# Patient Record
Sex: Female | Born: 2000 | Race: White | Hispanic: No | Marital: Single | Smoking: Never smoker
Health system: Southern US, Community
[De-identification: ages and names within clinical notes are randomized; demographics above are authoritative.]

## PROBLEM LIST (undated history)

## (undated) DIAGNOSIS — R45851 Suicidal ideations: Secondary | ICD-10-CM

## (undated) DIAGNOSIS — F329 Major depressive disorder, single episode, unspecified: Secondary | ICD-10-CM

## (undated) DIAGNOSIS — F419 Anxiety disorder, unspecified: Secondary | ICD-10-CM

## (undated) DIAGNOSIS — E7800 Pure hypercholesterolemia, unspecified: Secondary | ICD-10-CM

## (undated) DIAGNOSIS — F32A Depression, unspecified: Secondary | ICD-10-CM

## (undated) DIAGNOSIS — G43909 Migraine, unspecified, not intractable, without status migrainosus: Secondary | ICD-10-CM

## (undated) DIAGNOSIS — N3281 Overactive bladder: Secondary | ICD-10-CM

## (undated) HISTORY — DX: Suicidal ideations: R45.851

## (undated) HISTORY — DX: Anxiety disorder, unspecified: F41.9

## (undated) HISTORY — PX: TONSILLECTOMY: SUR1361

---
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
## 2019-11-28 ENCOUNTER — Emergency Department (HOSPITAL_COMMUNITY): Payer: Medicaid Other

## 2019-11-28 ENCOUNTER — Encounter (HOSPITAL_COMMUNITY): Payer: Self-pay | Admitting: Emergency Medicine

## 2019-11-28 ENCOUNTER — Emergency Department (HOSPITAL_COMMUNITY)
Admission: EM | Admit: 2019-11-28 | Discharge: 2019-11-28 | Disposition: A | Discharge status: Home / Self Care | Payer: Medicaid Other | Attending: Emergency Medicine | Admitting: Emergency Medicine

## 2019-11-28 ENCOUNTER — Other Ambulatory Visit: Payer: Self-pay

## 2019-11-28 DIAGNOSIS — Y93I9 Activity, other involving external motion: Secondary | ICD-10-CM | POA: Insufficient documentation

## 2019-11-28 DIAGNOSIS — Z79899 Other long term (current) drug therapy: Secondary | ICD-10-CM | POA: Insufficient documentation

## 2019-11-28 DIAGNOSIS — M791 Myalgia, unspecified site: Secondary | ICD-10-CM | POA: Insufficient documentation

## 2019-11-28 DIAGNOSIS — Y92410 Unspecified street and highway as the place of occurrence of the external cause: Secondary | ICD-10-CM | POA: Insufficient documentation

## 2019-11-28 DIAGNOSIS — M25511 Pain in right shoulder: Secondary | ICD-10-CM | POA: Insufficient documentation

## 2019-11-28 DIAGNOSIS — R42 Dizziness and giddiness: Secondary | ICD-10-CM | POA: Insufficient documentation

## 2019-11-28 DIAGNOSIS — Y999 Unspecified external cause status: Secondary | ICD-10-CM | POA: Diagnosis not present

## 2019-11-28 DIAGNOSIS — M542 Cervicalgia: Secondary | ICD-10-CM | POA: Insufficient documentation

## 2019-11-28 DIAGNOSIS — R519 Headache, unspecified: Secondary | ICD-10-CM | POA: Diagnosis present

## 2019-11-28 DIAGNOSIS — M7918 Myalgia, other site: Secondary | ICD-10-CM

## 2019-11-28 LAB — POC URINE PREG, ED: Preg Test, Ur: NEGATIVE
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
## 2019-11-28 NOTE — Discharge Instructions (Signed)
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
## 2020-01-23 DIAGNOSIS — F909 Attention-deficit hyperactivity disorder, unspecified type: Secondary | ICD-10-CM | POA: Insufficient documentation

## 2020-01-23 DIAGNOSIS — F419 Anxiety disorder, unspecified: Secondary | ICD-10-CM | POA: Insufficient documentation

## 2021-01-28 ENCOUNTER — Encounter (HOSPITAL_COMMUNITY): Payer: Self-pay | Admitting: *Deleted

## 2021-01-28 ENCOUNTER — Emergency Department (HOSPITAL_COMMUNITY)
Admission: EM | Admit: 2021-01-28 | Discharge: 2021-01-28 | Disposition: A | Discharge status: Home / Self Care | Payer: Medicaid Other | Attending: Emergency Medicine | Admitting: Emergency Medicine

## 2021-01-28 ENCOUNTER — Emergency Department (HOSPITAL_COMMUNITY): Payer: Medicaid Other

## 2021-01-28 ENCOUNTER — Other Ambulatory Visit: Payer: Self-pay

## 2021-01-28 DIAGNOSIS — R300 Dysuria: Secondary | ICD-10-CM | POA: Diagnosis not present

## 2021-01-28 DIAGNOSIS — R109 Unspecified abdominal pain: Secondary | ICD-10-CM | POA: Diagnosis not present

## 2021-01-28 DIAGNOSIS — R3915 Urgency of urination: Secondary | ICD-10-CM | POA: Diagnosis not present

## 2021-01-28 DIAGNOSIS — R10A0 Flank pain, unspecified side: Secondary | ICD-10-CM

## 2021-01-28 HISTORY — DX: Pure hypercholesterolemia, unspecified: E78.00

## 2021-01-28 HISTORY — DX: Overactive bladder: N32.81

## 2021-01-28 LAB — URINALYSIS, ROUTINE W REFLEX MICROSCOPIC
Bacteria, UA: NONE SEEN
Bilirubin Urine: NEGATIVE
Glucose, UA: NEGATIVE mg/dL
Hgb urine dipstick: NEGATIVE
Ketones, ur: NEGATIVE mg/dL
Nitrite: NEGATIVE
Protein, ur: NEGATIVE mg/dL
Specific Gravity, Urine: 1.021 (ref 1.005–1.030)
pH: 5 (ref 5.0–8.0)

## 2021-01-28 LAB — POC URINE PREG, ED: Preg Test, Ur: NEGATIVE

## 2021-01-28 MED ORDER — CEPHALEXIN 500 MG PO CAPS: 500.0000 mg | ORAL_CAPSULE | Freq: Two times a day (BID) | ORAL | 0 refills | Status: DC

## 2021-01-28 MED ORDER — PHENAZOPYRIDINE HCL 200 MG PO TABS: 200.0000 mg | ORAL_TABLET | Freq: Three times a day (TID) | ORAL | 0 refills | Status: DC

## 2021-01-28 NOTE — Discharge Instructions (Signed)
You are being treated for urinary tract infection given your symptoms.  Your urine is negative except for some white blood cells, a culture is currently pending.  Take the entire course of the antibiotics prescribed in addition you may use the Pyridium which can help you with the urgency and pain with urination.  This will turn your urine bright orange and is normal.  Your CT scan is negative for evidence of a kidney stone or other source of your symptoms.  Plan to see your doctor for recheck within 1 week if your symptoms are not completely resolved with this treatment plan.

## 2021-01-28 NOTE — ED Provider Notes (Signed)
[facility] EMERGENCY DEPARTMENT Provider Note   CSN: [id number] Arrival date & time: 01/28/21  1255     History Chief Complaint  Patient presents with  . Flank Pain    Cheryl Mcgrath is a 20 y.o. female with a history as outlined below, most significant for a distant history of overactive bladder in childhood which she outgrew, but does have occasional UTIs, most recently July 2021 presenting with a 2-week history of intermittent right flank pain with cloudy sometimes blood-tinged urine and urgency with increased frequency of urination.  She describes burning pain at the end of her urine stream and a pressure sensation at her urethra.  She denies fevers or chills, nausea or vomiting, denies personal or family history of kidney stones.  She also denies vaginal discharge, also no risk factors for STDs.  She has taken Tylenol for her symptoms with mild improvement in pain.  HPI     Past Medical History:  Diagnosis Date  . Depression   . High cholesterol   . Migraine   . Overactive bladder     Patient Active Problem List   Diagnosis Date Noted  . MDD (major depressive disorder), single episode, severe , no psychosis (HCC) 09/29/2014  . ODD (oppositional defiant disorder) 09/29/2014    Past Surgical History:  Procedure Laterality Date  . TONSILLECTOMY       OB History    Gravida  0   Para  0   Term  0   Preterm  0   AB  0   Living  0     SAB  0   IAB  0   Ectopic  0   Multiple  0   Live Births  0           History reviewed. No pertinent family history.  Social History   Tobacco Use  . Smoking status: Never Smoker  . Smokeless tobacco: Never Used  Vaping Use  . Vaping Use: Never used  Substance Use Topics  . Alcohol use: No  . Drug use: No    Home Medications Prior to Admission medications   Medication Sig Start Date End Date Taking? Authorizing Provider  cephALEXin (KEFLEX) 500 MG capsule Take 1 capsule (500 mg total) by mouth 2 (two)  times daily. 01/28/21  Yes Hughes Wyndham, Raynelle Fanning, PA-C  phenazopyridine (PYRIDIUM) 200 MG tablet Take 1 tablet (200 mg total) by mouth 3 (three) times daily. 01/28/21  Yes Ester Mabe, Raynelle Fanning, PA-C  hydrocortisone (ANUSOL-HC) 25 MG suppository Place 1 suppository (25 mg total) rectally 2 (two) times daily. 08/24/17   Burgess Amor, PA-C  ibuprofen (ADVIL,MOTRIN) 600 MG tablet Take 1 tablet (600 mg total) by mouth every 6 (six) hours as needed. 08/24/17   Burgess Amor, PA-C    Allergies    Patient has no known allergies.  Review of Systems   Review of Systems  Constitutional: Negative for chills and fever.  HENT: Negative for congestion and sore throat.   Eyes: Negative.   Respiratory: Negative for chest tightness and shortness of breath.   Cardiovascular: Negative for chest pain.  Gastrointestinal: Negative for abdominal pain, nausea and vomiting.  Genitourinary: Positive for dysuria, flank pain and urgency. Negative for pelvic pain and vaginal discharge.  Musculoskeletal: Negative for arthralgias, joint swelling and neck pain.  Skin: Negative.  Negative for rash and wound.  Neurological: Negative for dizziness, weakness, light-headedness, numbness and headaches.  Psychiatric/Behavioral: Negative.   All other systems reviewed and are negative.  Physical Exam Updated Vital Signs BP (!) 144/88 (BP Location: Right Arm)   Pulse (!) 114   Temp 98.2 F (36.8 C) (Oral)   Resp 16   Ht 4\' 11"  (1.499 m)   Wt 122.7 kg   LMP 12/18/2020 Comment: home test - x 2  SpO2 100%   BMI 54.61 kg/m   Physical Exam Vitals and nursing note reviewed.  Constitutional:      Appearance: She is well-developed and well-nourished.  HENT:     Head: Normocephalic and atraumatic.  Eyes:     Conjunctiva/sclera: Conjunctivae normal.  Cardiovascular:     Rate and Rhythm: Normal rate and regular rhythm.     Pulses: Intact distal pulses.     Heart sounds: Normal heart sounds.  Pulmonary:     Effort: Pulmonary effort is normal.      Breath sounds: Normal breath sounds. No wheezing.  Abdominal:     General: Bowel sounds are normal.     Palpations: Abdomen is soft.     Tenderness: There is no abdominal tenderness. There is no right CVA tenderness, left CVA tenderness, guarding or rebound.  Musculoskeletal:        General: Normal range of motion.     Cervical back: Normal range of motion.  Skin:    General: Skin is warm and dry.  Neurological:     Mental Status: She is alert.  Psychiatric:        Mood and Affect: Mood and affect normal.     ED Results / Procedures / Treatments   Labs (all labs ordered are listed, but only abnormal results are displayed) Labs Reviewed  URINALYSIS, ROUTINE W REFLEX MICROSCOPIC - Abnormal; Notable for the following components:      Result Value   APPearance HAZY (*)    Leukocytes,Ua SMALL (*)    All other components within normal limits  URINE CULTURE  POC URINE PREG, ED    EKG None  Radiology CT Renal Stone Study  Result Date: 01/28/2021 CLINICAL DATA:  Intermittent right flank pain for 2 weeks. Hematuria. EXAM: CT ABDOMEN AND PELVIS WITHOUT CONTRAST TECHNIQUE: Multidetector CT imaging of the abdomen and pelvis was performed following the standard protocol without IV contrast. COMPARISON:  None. FINDINGS: Lower chest: Clear lung bases. Hepatobiliary: No focal liver abnormality is seen. No gallstones, gallbladder wall thickening, or biliary dilatation. Pancreas: Unremarkable. Spleen: Unremarkable. Adrenals/Urinary Tract: Unremarkable adrenal glands. No evidence of renal calculi, mass, or hydronephrosis. Unremarkable bladder. Stomach/Bowel: The stomach is unremarkable. There is no evidence of bowel obstruction or inflammation. The appendix is unremarkable. Vascular/Lymphatic: Normal caliber of the abdominal aorta. No enlarged lymph nodes. Reproductive: Unremarkable appearance of the uterus and ovaries. Other: No intraperitoneal free fluid. Musculoskeletal: No acute osseous  abnormality or suspicious osseous lesion. IMPRESSION: Unremarkable CT of the abdomen and pelvis. Electronically Signed   By: 03/28/2021 M.D.   On: 01/28/2021 15:56    Procedures Procedures   Medications Ordered in ED Medications - No data to display  ED Course  I have reviewed the triage vital signs and the nursing notes.  Pertinent labs & imaging results that were available during my care of the patient were reviewed by me and considered in my medical decision making (see chart for details).    MDM Rules/Calculators/A&P                          Labs and imaging reviewed, urine culture has been ordered.  Patient with dysuria including urgency and painful urination, positive for leukocytes in her urine, no bacteria or nitrites.  Given her symptoms, will place on short course of Keflex, Pyridium also given for symptom relief.  CT is negative for urinary tract stone and pyelonephritis.  She was advised to follow-up with her PCP if her symptoms do not improve with today's treatment plan.  She has no symptoms or history to suggest STD.   Final Clinical Impression(s) / ED Diagnoses Final diagnoses:  Flank pain  Dysuria    Rx / DC Orders ED Discharge Orders         Ordered    cephALEXin (KEFLEX) 500 MG capsule  2 times daily        01/28/21 1608    phenazopyridine (PYRIDIUM) 200 MG tablet  3 times daily        01/28/21 1608           Burgess Amor, Cordelia Poche 01/28/21 1631    Pollyann Savoy, MD 01/28/21 [phone]

## 2021-01-28 NOTE — ED Triage Notes (Signed)
Pt with right flank pain off and on x 2 weeks with blood in urine.  Pain at end of voiding from umbilical area that radiates down. Denies any fever or N/V.  Denies hx of kidney stones.  + hx of overactive bladder.

## 2021-01-30 LAB — URINE CULTURE: Culture: >=100000 — AB

## 2021-11-10 IMAGING — CT CT RENAL STONE PROTOCOL
2 of 4 series · 17 of 46 positions shown, 19 images · non-contrast
Comparison: None.

CLINICAL DATA: Intermittent right flank pain for 2 weeks.
Hematuria.

EXAM:
CT ABDOMEN AND PELVIS WITHOUT CONTRAST
TECHNIQUE: Multidetector CT imaging of the abdomen and pelvis was performed
following the standard protocol without IV contrast.

[Series 2: axial st · axial · 0.83mm/px · z∈[+792,+1197]mm · 14 of 95 slices shown, 16 images]
[im 7/95  soft-tissue]
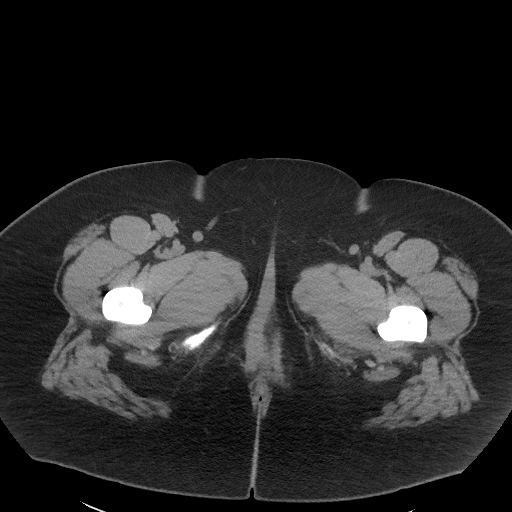
[im 7/95  bone]
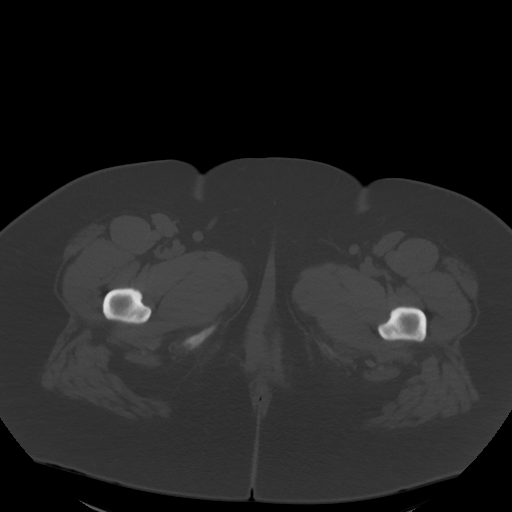
[im 13/95  soft-tissue]
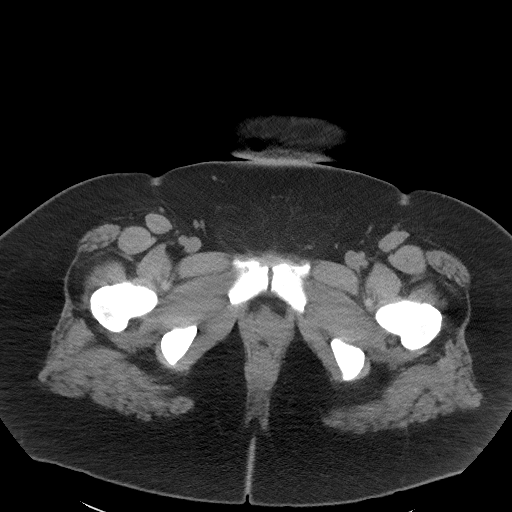
[im 19/95  soft-tissue]
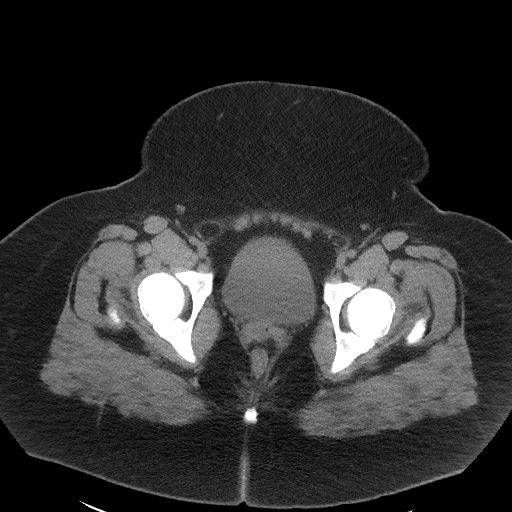
[im 26/95  soft-tissue]
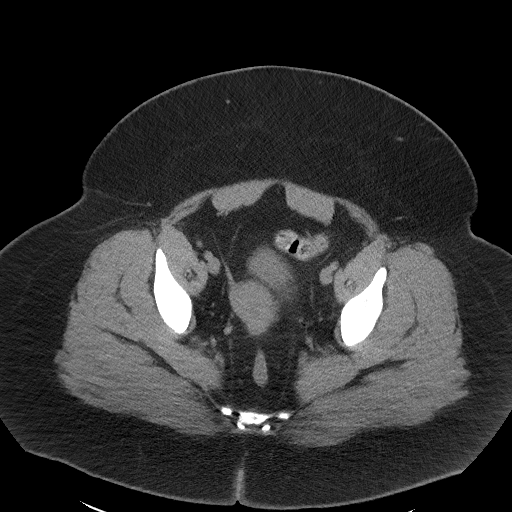
[im 32/95  soft-tissue]
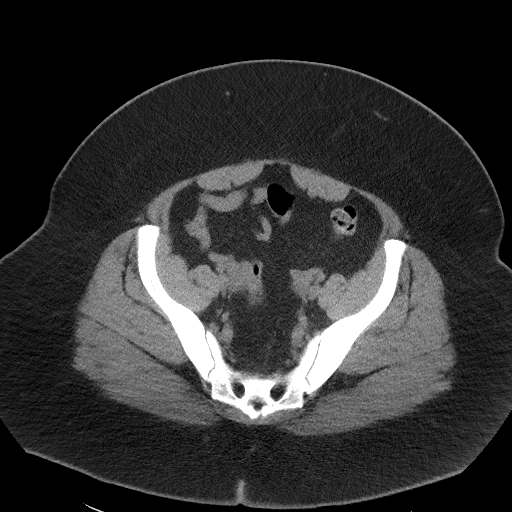
[im 38/95  soft-tissue]
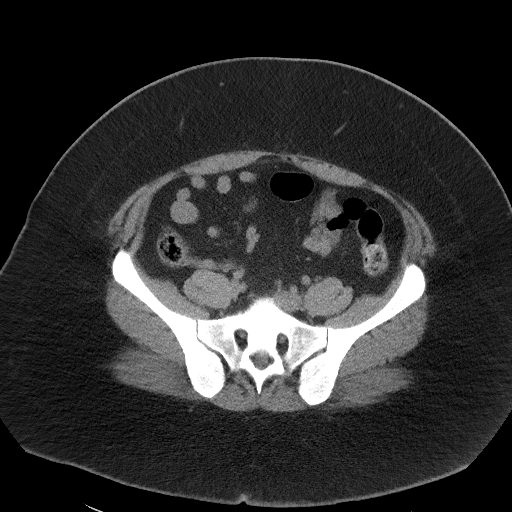
[im 44/95  soft-tissue]
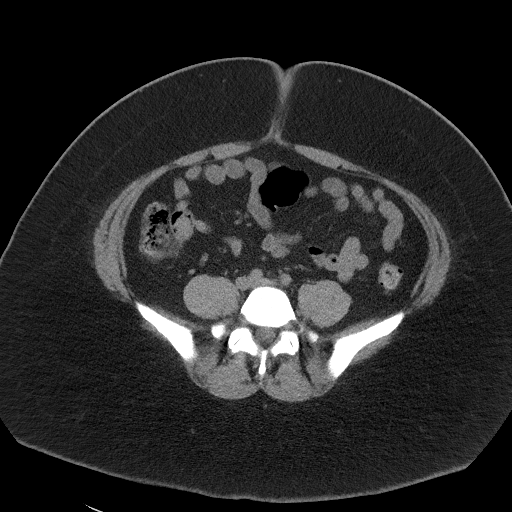
[im 51/95  soft-tissue]
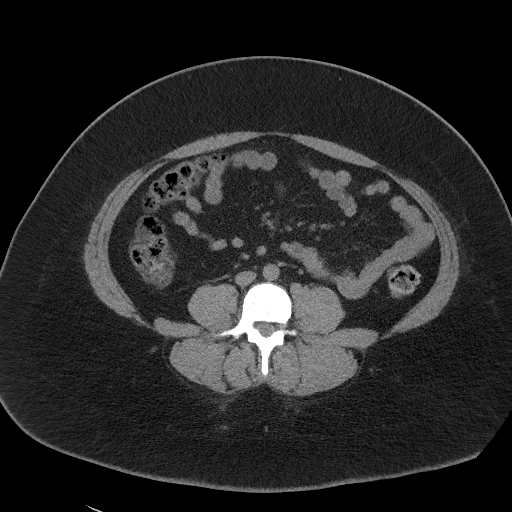
[im 57/95  soft-tissue]
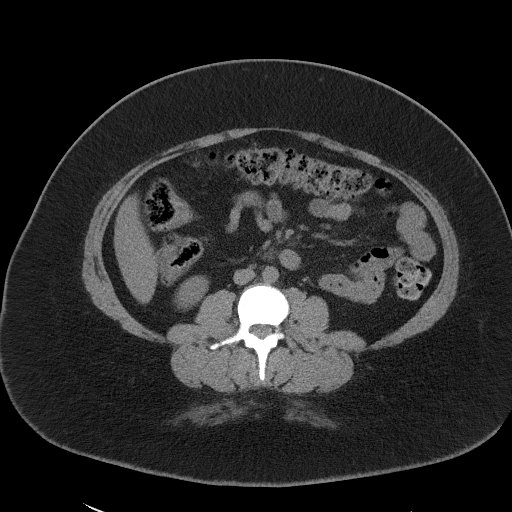
[im 57/95  bone]
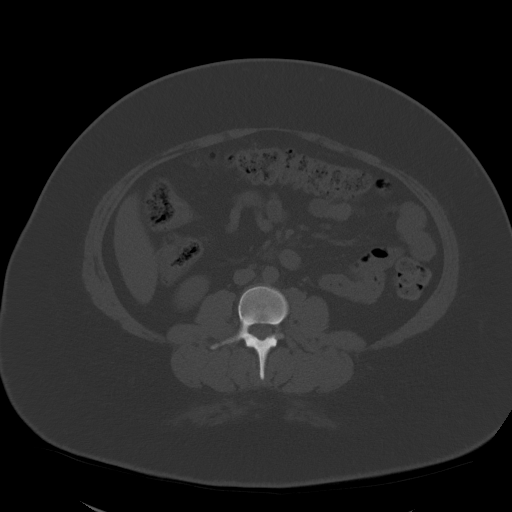
[im 63/95  soft-tissue]
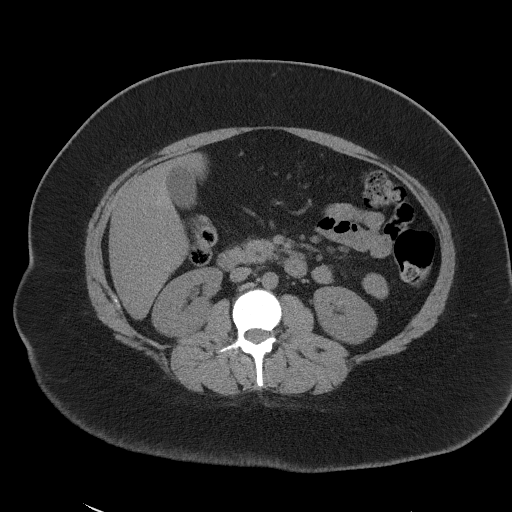
[im 69/95  soft-tissue]
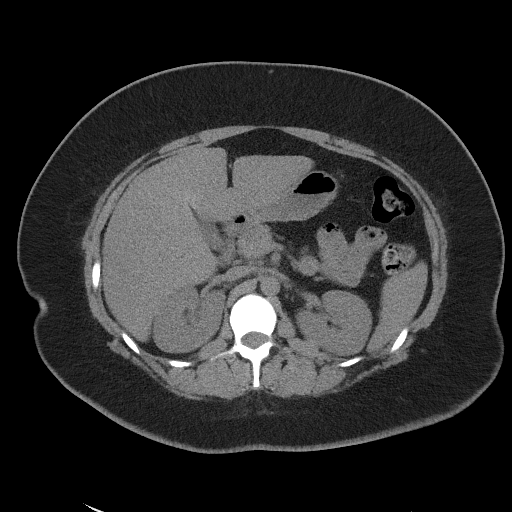
[im 76/95  soft-tissue]
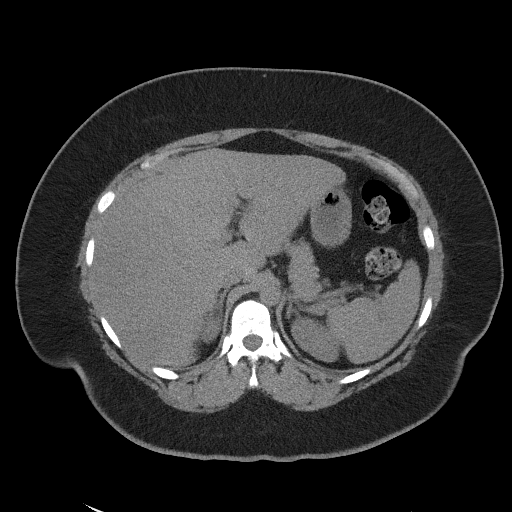
[im 82/95  soft-tissue]
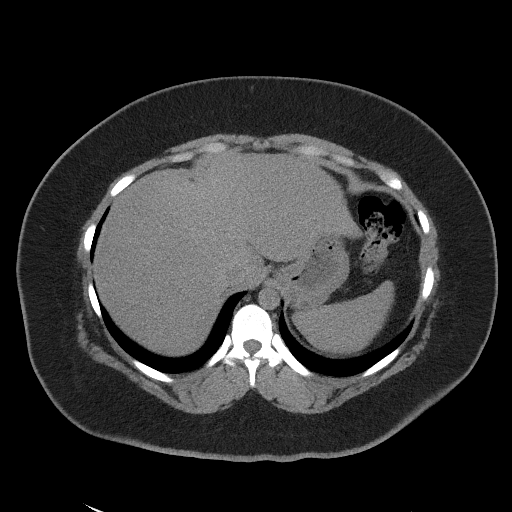
[im 88/95  soft-tissue]
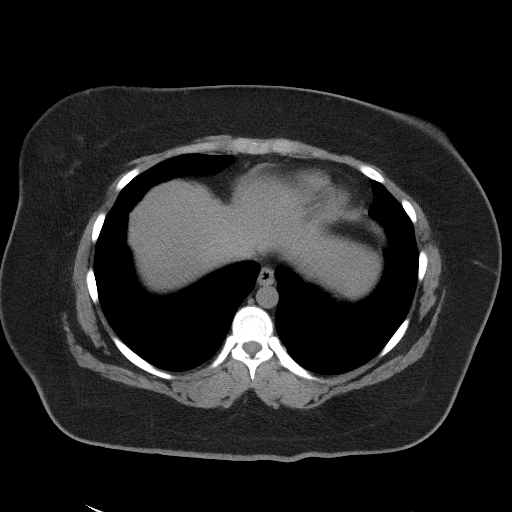

[Series 5: coronal st · coronal · 0.84mm/px · 3 of 120 slices shown]
[im 40/120  soft-tissue]
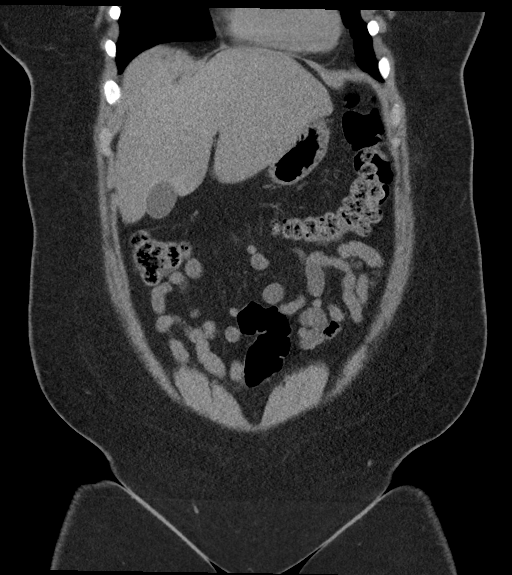
[im 53/120  soft-tissue]
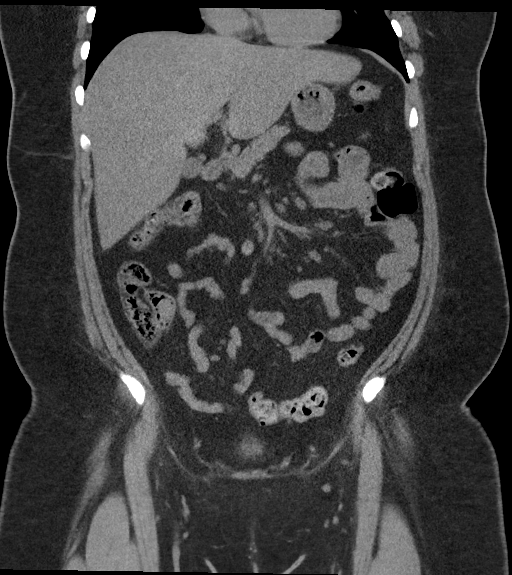
[im 67/120  soft-tissue]
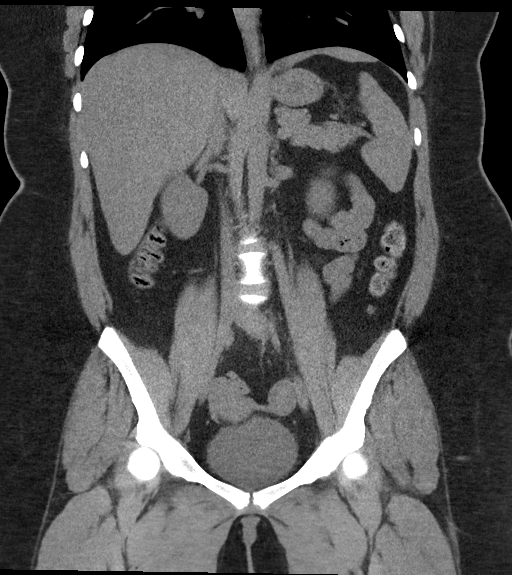

[17 of 46 positions shown; findings below may reference images not displayed]

FINDINGS: Lower chest: Clear lung bases.

Hepatobiliary: No focal liver abnormality is seen. No gallstones,
gallbladder wall thickening, or biliary dilatation.

Pancreas: Unremarkable.

Spleen: Unremarkable.

Adrenals/Urinary Tract: Unremarkable adrenal glands. No evidence of
renal calculi, mass, or hydronephrosis. Unremarkable bladder.

Stomach/Bowel: The stomach is unremarkable. There is no evidence of
bowel obstruction or inflammation. The appendix is unremarkable.

Vascular/Lymphatic: Normal caliber of the abdominal aorta. No
enlarged lymph nodes.

Reproductive: Unremarkable appearance of the uterus and ovaries.

Other: No intraperitoneal free fluid.

Musculoskeletal: No acute osseous abnormality or suspicious osseous
lesion.
IMPRESSION: Unremarkable CT of the abdomen and pelvis.

## 2021-11-12 ENCOUNTER — Ambulatory Visit
Admission: EM | Admit: 2021-11-12 | Discharge: 2021-11-12 | Discharge status: Against Medical Advice | Payer: Medicaid Other

## 2021-11-12 ENCOUNTER — Other Ambulatory Visit: Payer: Self-pay

## 2021-11-12 NOTE — ED Notes (Signed)
Called to phone by front desk no answered.   Called in lobby no answered.

## 2024-07-07 ENCOUNTER — Ambulatory Visit: Admitting: Physician Assistant

## 2024-07-07 ENCOUNTER — Encounter: Payer: Self-pay | Admitting: Physician Assistant

## 2024-07-07 VITALS — BP 140/92 | HR 88 | Temp 98.1°F | Ht 59.0 in | Wt 261.0 lb

## 2024-07-07 DIAGNOSIS — Z79899 Other long term (current) drug therapy: Secondary | ICD-10-CM | POA: Diagnosis not present

## 2024-07-07 DIAGNOSIS — Z309 Encounter for contraceptive management, unspecified: Secondary | ICD-10-CM

## 2024-07-07 DIAGNOSIS — I10 Essential (primary) hypertension: Secondary | ICD-10-CM | POA: Diagnosis not present

## 2024-07-07 DIAGNOSIS — E785 Hyperlipidemia, unspecified: Secondary | ICD-10-CM | POA: Insufficient documentation

## 2024-07-07 DIAGNOSIS — E782 Mixed hyperlipidemia: Secondary | ICD-10-CM

## 2024-07-07 DIAGNOSIS — Z7689 Persons encountering health services in other specified circumstances: Secondary | ICD-10-CM

## 2024-07-07 DIAGNOSIS — F3181 Bipolar II disorder: Secondary | ICD-10-CM | POA: Diagnosis not present

## 2024-07-07 DIAGNOSIS — Z113 Encounter for screening for infections with a predominantly sexual mode of transmission: Secondary | ICD-10-CM

## 2024-07-07 LAB — POCT URINE PREGNANCY: Preg Test, Ur: NEGATIVE

## 2024-07-07 MED ORDER — CARIPRAZINE HCL 1.5 MG PO CAPS: ORAL_CAPSULE | ORAL | 0 refills | Status: DC

## 2024-07-07 MED ORDER — AMLODIPINE BESYLATE 5 MG PO TABS: 5.0000 mg | ORAL_TABLET | Freq: Every day | ORAL | 1 refills | Status: DC

## 2024-07-07 NOTE — Assessment & Plan Note (Signed)
 Patient with history of elevated cholesterol. Lipid panel today. Discussed healthy diet and exercise.

## 2024-07-07 NOTE — Assessment & Plan Note (Addendum)
 146/96, 140/92 Uncontrolled. Start amlodipine  5 mg daily. Negative pregnancy test in office today.  Discussed DASH diet and dietary sodium restrictions.  Increase dietary efforts and physical activity. Follow up in 4-6 weeks.

## 2024-07-07 NOTE — Progress Notes (Signed)
 New Patient Office Visit  Subjective    Patient ID: Cheryl Mcgrath, female    DOB: 20-Feb-2001  Age: 23 y.o. MRN: [id number]  CC:  Chief Complaint  Patient presents with   New Patient (Initial Visit)    Looking to restart mediation. Would like labs and hernia checked. Also would like STI testing    HPI Cheryl Mcgrath presents to establish care  Patient presents today with past medical history significant for bipolar 2 disorder, anxiety, hypertension, hyperlipidemia, abdominal hernia, and previous suicide attempt. She voices interest in restarting medications and managing her mental health. She relates previous medications for mental health but is unable to recall medication names. She relates periods of time with extreme happiness followed by periods of long depressive states. She reports history of suicidal attempt and psychiatric hospitalization. She admits passing suicidal thoughts, but denies plans or intent to self harm. Additionally she reports history of hypertension controlled with amlodipine , has been off medication for approximately 2 months. Denies headaches, chest pain, or visual changes. Lastly, she desires STI screening today. Denies symptoms such as dysuria, vaginal discharge, odor, or bleeding.   Outpatient Encounter Medications as of 07/07/2024  Medication Sig   amLODipine  (NORVASC ) 5 MG tablet Take 1 tablet (5 mg total) by mouth daily.   cariprazine  (VRAYLAR ) 1.5 MG capsule Take 1 capsule (1.5 mg total) by mouth daily for 14 days, THEN 2 capsules (3 mg total) daily.   [DISCONTINUED] hydrOXYzine  (ATARAX ) 25 MG tablet Take 25 mg by mouth 3 (three) times daily as needed.   [DISCONTINUED] amLODipine  (NORVASC ) 10 MG tablet Take 10 mg by mouth daily.   [DISCONTINUED] cephALEXin  (KEFLEX ) 500 MG capsule Take 1 capsule (500 mg total) by mouth 2 (two) times daily. (Patient not taking: Reported on 07/07/2024)   [DISCONTINUED] hydrocortisone  (ANUSOL -HC) 25 MG suppository Place 1  suppository (25 mg total) rectally 2 (two) times daily. (Patient not taking: Reported on 07/07/2024)   [DISCONTINUED] ibuprofen  (ADVIL ,MOTRIN ) 600 MG tablet Take 1 tablet (600 mg total) by mouth every 6 (six) hours as needed. (Patient not taking: Reported on 07/07/2024)   [DISCONTINUED] phenazopyridine  (PYRIDIUM ) 200 MG tablet Take 1 tablet (200 mg total) by mouth 3 (three) times daily. (Patient not taking: Reported on 07/07/2024)   No facility-administered encounter medications on file as of 07/07/2024.    Past Medical History:  Diagnosis Date   Anxiety    Depression    High cholesterol    Migraine    Overactive bladder    Suicidal ideation     Past Surgical History:  Procedure Laterality Date   TONSILLECTOMY      Family History  Problem Relation Age of Onset   Heart disease Father    Hypertension Father     Social History   Socioeconomic History   Marital status: Single    Spouse name: Not on file   Number of children: Not on file   Years of education: Not on file   Highest education level: Not on file  Occupational History   Not on file  Tobacco Use   Smoking status: Never   Smokeless tobacco: Never  Vaping Use   Vaping status: Never Used  Substance and Sexual Activity   Alcohol use: No   Drug use: No   Sexual activity: Yes  Other Topics Concern   Not on file  Social History Narrative   Not on file   Social Drivers of Health   Financial Resource Strain: Not on file  Food Insecurity: Not on file  Transportation Needs: Not on file  Physical Activity: Not on file  Stress: Not on file  Social Connections: Not on file  Intimate Partner Violence: Not on file    Review of Systems  Constitutional:  Negative for fever, malaise/fatigue and weight loss.  Eyes:  Negative for blurred vision and double vision.  Respiratory:  Negative for shortness of breath.   Cardiovascular:  Negative for chest pain and palpitations.  Genitourinary:  Negative for dysuria.   Musculoskeletal:  Negative for back pain, joint pain and myalgias.  Neurological:  Negative for dizziness and headaches.  Psychiatric/Behavioral:  Positive for depression and suicidal ideas. Negative for hallucinations and substance abuse. The patient is nervous/anxious.       Objective    BP (!) 140/92   Pulse 88   Temp 98.1 F (36.7 C)   Ht 4' 11 (1.499 m)   Wt 261 lb (118.4 kg)   LMP 06/17/2024   SpO2 98%   BMI 52.72 kg/m   Physical Exam Constitutional:      Appearance: Normal appearance. She is obese.  HENT:     Head: Normocephalic and atraumatic.     Mouth/Throat:     Mouth: Mucous membranes are moist.     Pharynx: Oropharynx is clear.  Eyes:     Extraocular Movements: Extraocular movements intact.     Conjunctiva/sclera: Conjunctivae normal.  Cardiovascular:     Rate and Rhythm: Normal rate and regular rhythm.     Heart sounds: Normal heart sounds. No murmur heard. Pulmonary:     Effort: Pulmonary effort is normal.     Breath sounds: Normal breath sounds. No wheezing or rales.  Skin:    General: Skin is warm and dry.  Neurological:     General: No focal deficit present.     Mental Status: She is alert and oriented to person, place, and time.  Psychiatric:        Mood and Affect: Mood normal.        Behavior: Behavior normal.        Thought Content: Thought content normal.       Assessment & Plan:  Encounter to establish care  Bipolar 2 disorder [facility]) Assessment & Plan: Patient presents today with past medical history significant for bipolar disorder. She is unmedicated at this time. Discussed bipolar disorder in length today, include symptoms and treatment plans. Start Vraylar  1.5 mg daily for 2 weeks and increase to 3 mg if tolerating. Referral placed to psychiatry today. Negative pregnancy test in office. Will go ahead with lipid panel and thyroid studies today. Patient counseled on side effects, she was advised to stop taking medication if she  experiences suicidal ideation. Follow up in 4-6 weeks.   Orders: -     Cariprazine  HCl; Take 1 capsule (1.5 mg total) by mouth daily for 14 days, THEN 2 capsules (3 mg total) daily.  Dispense: 75 capsule; Refill: 0 -     Ambulatory referral to Psychiatry  Primary hypertension Assessment & Plan: 146/96, 140/92 Uncontrolled. Start amlodipine  5 mg daily. Negative pregnancy test in office today.  Discussed DASH diet and dietary sodium restrictions.  Increase dietary efforts and physical activity. Follow up in 4-6 weeks.   Orders: -     CBC with Differential/Platelet -     amLODIPine  Besylate; Take 1 tablet (5 mg total) by mouth daily.  Dispense: 90 tablet; Refill: 1  Screen for STD (sexually transmitted disease) -  Chlamydia/Gonococcus/Trichomonas, NAA -     HIV Antibody (routine testing w rflx) -     RPR  Mixed hyperlipidemia Assessment & Plan: Patient with history of elevated cholesterol. Lipid panel today. Discussed healthy diet and exercise.   Orders: -     Lipid panel  High risk medication use -     CMP14+EGFR -     TSH + free T4  Encounter for contraceptive management, unspecified type -     POCT urine pregnancy    Return in about 4 weeks (around 08/04/2024) for bipolar and blood pressure .   Charmaine Zachari Alberta, PA-C

## 2024-07-07 NOTE — Assessment & Plan Note (Addendum)
 Patient presents today with past medical history significant for bipolar disorder. She is unmedicated at this time. Discussed bipolar disorder in length today, include symptoms and treatment plans. Start Vraylar  1.5 mg daily for 2 weeks and increase to 3 mg if tolerating. Referral placed to psychiatry today. Negative pregnancy test in office. Will go ahead with lipid panel and thyroid studies today. Patient counseled on side effects, she was advised to stop taking medication if she experiences suicidal ideation. Follow up in 4-6 weeks.

## 2024-07-08 LAB — CBC WITH DIFFERENTIAL/PLATELET
Basophils Absolute: 0 x10E3/uL (ref 0.0–0.2)
Basos: 1 %
EOS (ABSOLUTE): 0.1 x10E3/uL (ref 0.0–0.4)
Eos: 1 %
Hematocrit: 45.6 % (ref 34.0–46.6)
Hemoglobin: 14.5 g/dL (ref 11.1–15.9)
Immature Grans (Abs): 0 x10E3/uL (ref 0.0–0.1)
Immature Granulocytes: 0 %
Lymphocytes Absolute: 2.5 x10E3/uL (ref 0.7–3.1)
Lymphs: 29 %
MCH: 30.2 pg (ref 26.6–33.0)
MCHC: 31.8 g/dL (ref 31.5–35.7)
MCV: 95 fL (ref 79–97)
Monocytes Absolute: 0.5 x10E3/uL (ref 0.1–0.9)
Monocytes: 6 %
Neutrophils Absolute: 5.4 x10E3/uL (ref 1.4–7.0)
Neutrophils: 63 %
Platelets: 280 x10E3/uL (ref 150–450)
RBC: 4.8 x10E6/uL (ref 3.77–5.28)
RDW: 14.4 % (ref 11.7–15.4)
WBC: 8.5 x10E3/uL (ref 3.4–10.8)

## 2024-07-08 LAB — CMP14+EGFR
ALT: 26 IU/L (ref 0–32)
AST: 19 IU/L (ref 0–40)
Albumin: 4.7 g/dL (ref 4.0–5.0)
Alkaline Phosphatase: 116 IU/L (ref 44–121)
BUN/Creatinine Ratio: 19 (ref 9–23)
BUN: 13 mg/dL (ref 6–20)
Bilirubin Total: 0.2 mg/dL (ref 0.0–1.2)
CO2: 22 mmol/L (ref 20–29)
Calcium: 10.3 mg/dL — ABNORMAL HIGH (ref 8.7–10.2)
Chloride: 101 mmol/L (ref 96–106)
Creatinine, Ser: 0.68 mg/dL (ref 0.57–1.00)
Globulin, Total: 2.3 g/dL (ref 1.5–4.5)
Glucose: 80 mg/dL (ref 70–99)
Potassium: 4.7 mmol/L (ref 3.5–5.2)
Sodium: 139 mmol/L (ref 134–144)
Total Protein: 7 g/dL (ref 6.0–8.5)
eGFR: 125 mL/min/1.73 (ref >59)

## 2024-07-08 LAB — LIPID PANEL
Chol/HDL Ratio: 5.2 ratio — ABNORMAL HIGH (ref 0.0–4.4)
Cholesterol, Total: 207 mg/dL — ABNORMAL HIGH (ref 100–199)
HDL: 40 mg/dL (ref >39)
LDL Chol Calc (NIH): 138 mg/dL — ABNORMAL HIGH (ref 0–99)
Triglycerides: 162 mg/dL — ABNORMAL HIGH (ref 0–149)
VLDL Cholesterol Cal: 29 mg/dL (ref 5–40)

## 2024-07-08 LAB — TSH+FREE T4
Free T4: 1.24 ng/dL (ref 0.82–1.77)
TSH: 2.33 u[IU]/mL (ref 0.450–4.500)

## 2024-07-08 LAB — HIV ANTIBODY (ROUTINE TESTING W REFLEX): HIV Screen 4th Generation wRfx: NONREACTIVE

## 2024-07-08 LAB — RPR: RPR Ser Ql: NONREACTIVE

## 2024-07-10 ENCOUNTER — Ambulatory Visit: Payer: Self-pay | Admitting: Physician Assistant

## 2024-07-10 LAB — CHLAMYDIA/GONOCOCCUS/TRICHOMONAS, NAA
Chlamydia by NAA: NEGATIVE
Gonococcus by NAA: NEGATIVE
Trich vag by NAA: NEGATIVE

## 2024-08-04 ENCOUNTER — Ambulatory Visit: Admitting: Physician Assistant

## 2024-08-04 ENCOUNTER — Telehealth: Payer: Self-pay | Admitting: Pharmacy Technician

## 2024-08-04 ENCOUNTER — Other Ambulatory Visit (HOSPITAL_COMMUNITY): Payer: Self-pay

## 2024-08-04 VITALS — BP 133/88 | HR 85 | Temp 98.4°F | Ht 59.0 in | Wt 257.0 lb

## 2024-08-04 DIAGNOSIS — I10 Essential (primary) hypertension: Secondary | ICD-10-CM | POA: Diagnosis not present

## 2024-08-04 DIAGNOSIS — F3181 Bipolar II disorder: Secondary | ICD-10-CM

## 2024-08-04 MED ORDER — ARIPIPRAZOLE 10 MG PO TABS
10.0000 mg | ORAL_TABLET | Freq: Every day | ORAL | 1 refills | Status: DC
Start: 2024-08-04 — End: 2024-08-09

## 2024-08-04 MED ORDER — AMLODIPINE BESYLATE 10 MG PO TABS: 10.0000 mg | ORAL_TABLET | Freq: Every day | ORAL | 1 refills | Status: AC

## 2024-08-04 NOTE — Telephone Encounter (Signed)
 Pharmacy Patient Advocate Encounter  Received notification from [facility] MEDICAID that Prior Authorization for ARIPiprazole  10MG  tablets  has been APPROVED from 08/04/2024 to 08/04/2025. Ran test claim, Copay is $4.00. This test claim was processed through [facility]- copay amounts may vary at other pharmacies due to pharmacy/plan contracts, or as the patient moves through the different stages of their insurance plan.   PA #/Case ID/Reference #: [id number]

## 2024-08-04 NOTE — Assessment & Plan Note (Addendum)
 133/88 Improved, however not at goal.  Continue current medication, increase to 10 mg daily.  Discussed DASH diet and dietary sodium restrictions.  Increase dietary efforts and physical activity. Follow up in 4-6 weeks.

## 2024-08-04 NOTE — Telephone Encounter (Signed)
 Pharmacy Patient Advocate Encounter   Received notification from CoverMyMeds that prior authorization for ARIPiprazole  10MG  tablets is required/requested.   Insurance verification completed.   The patient is insured through [facility] MEDICAID .   Per test claim: PA required; PA submitted to above mentioned insurance via LATENT Key/confirmation #/EOC [id number] Status is pending

## 2024-08-04 NOTE — Assessment & Plan Note (Addendum)
 Symptoms overall imrpoving, however patient not tolerating Vraylar . Will discontinue medication and trial Abilify  10 mg daily. Patient counseled on side effects such as nausea and headache, she was advised to stop taking medication if she experiences suicidal ideation. Discussed tapering off of Vraylar  for 1 week. Patient awaiting psychiatry appointment. Patient to follow up with me in 6 weeks.

## 2024-08-04 NOTE — Progress Notes (Signed)
 Established Patient Office Visit  Subjective   Patient ID: Cheryl Mcgrath, female    DOB: 2001/04/30  Age: 23 y.o. MRN: [id number]  Chief Complaint  Patient presents with   Hypertension   antipsychotics    States makes her very sleepy and does not notice much difference in effectiveness  Taking 3 to 4 weeks    Patient presents today for follow up regarding bipolar disorder and hypertension. Currently on amlodipine  for blood pressure control and states daily compliance with medications. Patient denies headaches, blurry vision, dizziness, chest pain, shortness of breath, or palpitations. However, endorses hot flashes and would like to increase amlodipine  dose. Additionally, she reports improved bipolar symptoms on Vraylar  however endorses significant sleepiness, which interferes with her work as she works 3rd shift. She is requesting medication adjustment today. Denies SI.      Review of Systems  Constitutional:  Positive for malaise/fatigue. Negative for chills and fever.  Eyes:  Negative for blurred vision and double vision.  Respiratory:  Negative for shortness of breath.   Cardiovascular:  Negative for chest pain and palpitations.  Neurological:  Negative for dizziness and headaches.  Psychiatric/Behavioral:  Positive for depression (improving). The patient is nervous/anxious (improving).       Objective:     BP 133/88   Pulse 85   Temp 98.4 F (36.9 C)   Ht 4' 11 (1.499 m)   Wt 257 lb (116.6 kg)   LMP 06/17/2024   SpO2 99%   BMI 51.91 kg/m    Physical Exam Constitutional:      Appearance: Normal appearance.  HENT:     Head: Normocephalic and atraumatic.     Mouth/Throat:     Mouth: Mucous membranes are moist.     Pharynx: Oropharynx is clear.  Eyes:     Extraocular Movements: Extraocular movements intact.     Conjunctiva/sclera: Conjunctivae normal.  Cardiovascular:     Rate and Rhythm: Normal rate and regular rhythm.     Heart sounds: Normal heart  sounds. No murmur heard. Pulmonary:     Effort: Pulmonary effort is normal.     Breath sounds: Normal breath sounds. No wheezing or rales.  Musculoskeletal:     Right lower leg: No edema.     Left lower leg: No edema.  Skin:    General: Skin is warm and dry.  Neurological:     General: No focal deficit present.     Mental Status: She is alert and oriented to person, place, and time.  Psychiatric:        Mood and Affect: Mood normal. Affect is flat.        Behavior: Behavior normal.     No results found for any visits on 08/04/24.  The ASCVD Risk score (Arnett DK, et al., 2019) failed to calculate for the following reasons:   The 2019 ASCVD risk score is only valid for ages 72 to 61    Assessment & Plan:   Return in about 6 weeks (around 09/15/2024) for BP and bipoalr .   Bipolar 2 disorder (HCC) Assessment & Plan: Symptoms overall imrpoving, however patient not tolerating Vraylar . Will discontinue medication and trial Abilify  10 mg daily. Patient counseled on side effects such as nausea and headache, she was advised to stop taking medication if she experiences suicidal ideation. Discussed tapering off of Vraylar  for 1 week. Patient awaiting psychiatry appointment. Patient to follow up with me in 6 weeks.    Orders: -  ARIPiprazole ; Take 1 tablet (10 mg total) by mouth daily.  Dispense: 30 tablet; Refill: 1  Primary hypertension Assessment & Plan: 133/88 Improved, however not at goal.  Continue current medication, increase to 10 mg daily.  Discussed DASH diet and dietary sodium restrictions.  Increase dietary efforts and physical activity. Follow up in 4-6 weeks.   Orders: -     amLODIPine  Besylate; Take 1 tablet (10 mg total) by mouth daily.  Dispense: 90 tablet; Refill: 1    Lason Eveland, PA-C

## 2024-08-08 ENCOUNTER — Encounter: Payer: Self-pay | Admitting: Physician Assistant

## 2024-08-09 ENCOUNTER — Other Ambulatory Visit: Payer: Self-pay | Admitting: Medical Genetics

## 2024-08-09 ENCOUNTER — Telehealth: Payer: Self-pay | Admitting: Pharmacy Technician

## 2024-08-09 ENCOUNTER — Other Ambulatory Visit (HOSPITAL_COMMUNITY): Payer: Self-pay

## 2024-08-09 ENCOUNTER — Other Ambulatory Visit: Payer: Self-pay | Admitting: Physician Assistant

## 2024-08-09 DIAGNOSIS — F3181 Bipolar II disorder: Secondary | ICD-10-CM

## 2024-08-09 MED ORDER — LURASIDONE HCL 20 MG PO TABS
ORAL_TABLET | ORAL | 0 refills | Status: DC
Start: 2024-08-09 — End: 2024-08-10

## 2024-08-09 NOTE — Telephone Encounter (Signed)
 Pharmacy Patient Advocate Encounter   Received notification from CoverMyMeds that prior authorization for Lurasidone  HCl 20MG  tablets is required/requested.   Insurance verification completed.   The patient is insured through [facility] MEDICAID .   Per test claim: PA required; PA submitted to above mentioned insurance via Latent Key/confirmation #/EOC [id number] Status is pending

## 2024-08-10 ENCOUNTER — Other Ambulatory Visit: Payer: Self-pay | Admitting: Physician Assistant

## 2024-08-10 DIAGNOSIS — F3181 Bipolar II disorder: Secondary | ICD-10-CM

## 2024-08-10 MED ORDER — LURASIDONE HCL 60 MG PO TABS: 60.0000 mg | ORAL_TABLET | Freq: Every day | ORAL | 0 refills | Status: DC

## 2024-08-10 MED ORDER — LURASIDONE HCL 20 MG PO TABS: 20.0000 mg | ORAL_TABLET | Freq: Every day | ORAL | 0 refills | Status: DC

## 2024-08-10 MED ORDER — LURASIDONE HCL 40 MG PO TABS: 40.0000 mg | ORAL_TABLET | Freq: Every day | ORAL | 0 refills | Status: DC

## 2024-08-10 NOTE — Telephone Encounter (Signed)
 Pharmacy Patient Advocate Encounter  Received notification from [facility] MEDICAID that Prior Authorization for Lurasidone  HCl 20MG  tablets  has been DENIED.  Full denial letter will be uploaded to the media tab. See denial reason below.   PA #/Case ID/Reference #: [id number]  Max of 1 tablet per day. Rx would need to be reordered in the individual doses of 20mg  40mg  and 60mg  tablets.

## 2024-08-15 ENCOUNTER — Other Ambulatory Visit (HOSPITAL_COMMUNITY)

## 2024-08-31 ENCOUNTER — Ambulatory Visit: Admitting: Physician Assistant

## 2024-09-01 ENCOUNTER — Encounter: Payer: Self-pay | Admitting: Physician Assistant

## 2024-09-01 ENCOUNTER — Ambulatory Visit: Admitting: Physician Assistant

## 2024-09-01 VITALS — BP 127/85 | HR 90 | Ht 59.0 in | Wt 247.0 lb

## 2024-09-01 DIAGNOSIS — Z6841 Body Mass Index (BMI) 40.0 and over, adult: Secondary | ICD-10-CM | POA: Insufficient documentation

## 2024-09-01 DIAGNOSIS — S46001A Unspecified injury of muscle(s) and tendon(s) of the rotator cuff of right shoulder, initial encounter: Secondary | ICD-10-CM | POA: Diagnosis not present

## 2024-09-01 DIAGNOSIS — F3181 Bipolar II disorder: Secondary | ICD-10-CM

## 2024-09-01 MED ORDER — QUETIAPINE FUMARATE 100 MG PO TABS: 100.0000 mg | ORAL_TABLET | Freq: Every day | ORAL | 1 refills | Status: DC

## 2024-09-01 MED ORDER — TIZANIDINE HCL 4 MG PO TABS: 4.0000 mg | ORAL_TABLET | Freq: Four times a day (QID) | ORAL | 0 refills | Status: AC | PRN

## 2024-09-01 MED ORDER — CELECOXIB 50 MG PO CAPS
50.0000 mg | ORAL_CAPSULE | Freq: Two times a day (BID) | ORAL | 1 refills | Status: DC
Start: 2024-09-01 — End: 2024-10-17

## 2024-09-01 NOTE — Assessment & Plan Note (Signed)
 Bipolar II disorder with inadequate symptom control on Latuda  60 mg, causing restlessness. Symptoms include mood fluctuations, depression, irritability, and agitation. Current baseline mood is neutral. Referral to psychiatry delayed until March, necessitating exploration of alternative medications. - Discontinue Latuda , start seroquel  100 mg at bedtime.  - Attempt to redirect psychiatry referral to an earlier date at alternative locations such as [facility], [facility], or [facility]. - Follow up in month to evaluate effectiveness of medication and up titrate as indicated. - warning signs such as worsening depression and suicidal thoughts discussed.

## 2024-09-01 NOTE — Assessment & Plan Note (Signed)
 Right neck and shoulder pain persisting for 3-4 weeks, likely due to musculoskeletal strain from repetitive work-related activities. Examination suggests involvement of the rotator cuff muscles, with no indication for imaging. - Prescribe tizanidine  to be taken at bedtime. - Prescribe Celebrex  twice daily for pain and inflammation.  - Provide a handout on rotator cuff rehabilitation exercises. - Advise her to try exercises and medication for 2 weeks and follow up via MyChart if no improvement. - Consider referral to physical therapy if symptoms persist after 2 weeks. - Provide a work note for lighter duty for one month.

## 2024-09-01 NOTE — Progress Notes (Addendum)
 Established Patient Office Visit  Subjective   Patient ID: Cheryl Mcgrath, female    DOB: 06-Sep-2001  Age: 24 y.o. MRN: [id number]  Chief Complaint  Patient presents with   Medical Management of Chronic Issues   Neck Pain    Discussed the use of AI scribe software for clinical note transcription with the patient, who gave verbal consent to proceed.  History of Present Illness Cheryl Mcgrath is a 23 year old female with bipolar disorder who presents with concerns about medication efficacy and side effects.  She does not feel that Latuda , currently at 60 mg, is effective after titrating from 20 mg and 40 mg over two to three weeks. Restlessness is present but less severe than with previous medications. Abilify  caused restlessness, and Vraylar  led to excessive sleepiness. Her bipolar symptoms include mood fluctuations with episodes of elevated mood followed by depression, agitation, and irritability. She recently emerged from a depressive episode and feels neutral today.  She experiences right-sided neck and shoulder pain for three to four weeks, associated with work activities involving moving, reaching, and pulling. The pain is burning and tingling, localized to the right shoulder, without radiation down the arm. Over-the-counter medications like Tylenol  and ibuprofen  have not provided relief. She is currently helping with remodeling at work, which involves physical activities that may contribute to her shoulder pain. No thoughts of self-harm with the new medication.    Review of Systems  Constitutional:  Negative for chills, fever and malaise/fatigue.  Respiratory:  Negative for shortness of breath.   Cardiovascular:  Negative for chest pain and palpitations.  Musculoskeletal:  Positive for joint pain. Negative for falls and neck pain.  Neurological:  Negative for dizziness and headaches.  Psychiatric/Behavioral:  Positive for depression.       Objective:     BP 127/85    Pulse 90   Ht 4' 11 (1.499 m)   Wt 247 lb (112 kg)   LMP 06/17/2024   SpO2 100%   BMI 49.89 kg/m    Physical Exam Constitutional:      Appearance: Normal appearance. She is obese.  HENT:     Head: Normocephalic and atraumatic.     Mouth/Throat:     Mouth: Mucous membranes are moist.     Pharynx: Oropharynx is clear.  Eyes:     Extraocular Movements: Extraocular movements intact.     Conjunctiva/sclera: Conjunctivae normal.  Cardiovascular:     Rate and Rhythm: Normal rate and regular rhythm.     Heart sounds: Normal heart sounds. No murmur heard. Pulmonary:     Effort: Pulmonary effort is normal.     Breath sounds: Normal breath sounds.  Musculoskeletal:     Right shoulder: Tenderness present. No swelling, deformity or bony tenderness. Normal range of motion. Normal strength.  Skin:    General: Skin is warm and dry.  Neurological:     General: No focal deficit present.     Mental Status: She is alert and oriented to person, place, and time.  Psychiatric:        Mood and Affect: Mood normal.        Behavior: Behavior normal.     No results found for any visits on 09/01/24.  The ASCVD Risk score (Arnett DK, et al., 2019) failed to calculate for the following reasons:   The 2019 ASCVD risk score is only valid for ages 24 to 6    Assessment & Plan:   Return in about 4 weeks (  around 09/29/2024).   Bipolar 2 disorder (HCC) Assessment & Plan: Bipolar II disorder with inadequate symptom control on Latuda  60 mg, causing restlessness. Symptoms include mood fluctuations, depression, irritability, and agitation. Current baseline mood is neutral. Referral to psychiatry delayed until March, necessitating exploration of alternative medications. - Discontinue Latuda , start seroquel  100 mg at bedtime.  - Attempt to redirect psychiatry referral to an earlier date at alternative locations such as [facility], [facility], or [facility]. - Follow up in month to evaluate  effectiveness of medication and up titrate as indicated. - warning signs such as worsening depression and suicidal thoughts discussed.   Orders: -     QUEtiapine  Fumarate; Take 1 tablet (100 mg total) by mouth at bedtime.  Dispense: 30 tablet; Refill: 1 -     Ambulatory referral to Psychiatry  Injury of right rotator cuff, initial encounter Assessment & Plan: Right neck and shoulder pain persisting for 3-4 weeks, likely due to musculoskeletal strain from repetitive work-related activities. Examination suggests involvement of the rotator cuff muscles, with no indication for imaging. - Prescribe tizanidine  to be taken at bedtime. - Prescribe Celebrex  twice daily for pain and inflammation.  - Provide a handout on rotator cuff rehabilitation exercises. - Advise her to try exercises and medication for 2 weeks and follow up via MyChart if no improvement. - Consider referral to physical therapy if symptoms persist after 2 weeks. - Provide a work note for lighter duty for one month.  Orders: -     tiZANidine  HCl; Take 1 tablet (4 mg total) by mouth every 6 (six) hours as needed for muscle spasms.  Dispense: 30 tablet; Refill: 0 -     Celecoxib ; Take 1 capsule (50 mg total) by mouth 2 (two) times daily.  Dispense: 60 capsule; Refill: 1    Agapita Savarino, PA-C

## 2024-09-01 NOTE — Addendum Note (Signed)
 Addended by: MANCIL PFEIFFER on: 09/01/2024 10:15 AM   Modules accepted: Orders

## 2024-09-29 ENCOUNTER — Ambulatory Visit: Admitting: Physician Assistant

## 2024-10-02 ENCOUNTER — Ambulatory Visit: Admitting: Physician Assistant

## 2024-10-05 ENCOUNTER — Encounter: Payer: Self-pay | Admitting: Physician Assistant

## 2024-10-05 ENCOUNTER — Ambulatory Visit: Admitting: Physician Assistant

## 2024-10-05 VITALS — BP 117/82 | HR 82 | Ht 59.0 in | Wt 240.0 lb

## 2024-10-05 DIAGNOSIS — I10 Essential (primary) hypertension: Secondary | ICD-10-CM

## 2024-10-05 DIAGNOSIS — F3181 Bipolar II disorder: Secondary | ICD-10-CM

## 2024-10-05 MED ORDER — QUETIAPINE FUMARATE 200 MG PO TABS: 200.0000 mg | ORAL_TABLET | Freq: Every day | ORAL | 0 refills | Status: DC

## 2024-10-05 NOTE — Progress Notes (Signed)
 Established Patient Office Visit  Subjective   Patient ID: Cheryl Mcgrath, female    DOB: [birth year]  Age: 23 y.o. MRN: [id number]  Chief Complaint  Patient presents with   Medical Management of Chronic Issues    Discussed the use of AI scribe software for clinical note transcription with the patient, who gave verbal consent to proceed.  History of Present Illness Cheryl Mcgrath is a 23 year old female who presents for medication management of bipolar disorder.  Her depression and anxiety symptoms have improved with her current medication regimen. Depression and anxiety screening score both greatly improved since last visit. She takes Seroquel  at bedtime. She is interested in increasing the dose. She has a therapy appointment scheduled for November and a psychiatric appointment in March. She does not have additional concerns or complaints. She is doing well on her blood pressure medication.     Review of Systems  Constitutional:  Negative for chills, fever and malaise/fatigue.  Respiratory:  Negative for shortness of breath.   Cardiovascular:  Negative for chest pain.  Musculoskeletal:  Positive for back pain (improving). Negative for joint pain.  Neurological:  Negative for dizziness and headaches.  Psychiatric/Behavioral:  Positive for depression (improving). The patient is nervous/anxious (improving).       Objective:     BP 117/82   Pulse 82   Ht 4' 11 (1.499 m)   Wt 240 lb (108.9 kg)   BMI 48.47 kg/m    Physical Exam Constitutional:      General: She is not in acute distress.    Appearance: Normal appearance. She is obese. She is not ill-appearing.  HENT:     Head: Normocephalic and atraumatic.     Mouth/Throat:     Mouth: Mucous membranes are moist.     Pharynx: Oropharynx is clear.  Eyes:     Extraocular Movements: Extraocular movements intact.     Conjunctiva/sclera: Conjunctivae normal.  Cardiovascular:     Rate and Rhythm: Normal rate and regular  rhythm.     Heart sounds: Normal heart sounds. No murmur heard. Pulmonary:     Effort: Pulmonary effort is normal.     Breath sounds: Normal breath sounds.  Musculoskeletal:     Right lower leg: No edema.     Left lower leg: No edema.  Skin:    General: Skin is warm and dry.  Neurological:     General: No focal deficit present.     Mental Status: She is alert and oriented to person, place, and time.  Psychiatric:        Mood and Affect: Mood normal.        Behavior: Behavior normal.     No results found for any visits on 10/05/24.  The ASCVD Risk score (Arnett DK, et al., 2019) failed to calculate for the following reasons:   The 2019 ASCVD risk score is only valid for ages 51 to 34    Assessment & Plan:   Return in about 3 months (around 01/05/2025) for medication f/u .   Bipolar 2 disorder (HCC) Assessment & Plan: Bipolar disorder managed with Seroquel . Depression and anxiety scores improved significantly since medication change. Patient denies adverse effects and is interested in a dose increase.  - Increase Seroquel  to 200 mg daily. Transition with 150 mg for two weeks, then 200 mg daily. - Scheduled for therapy appointment in November and psychiatric appointment in March. - Follow up with me in 3 months.   Orders: -  QUEtiapine  Fumarate; Take 1 tablet (200 mg total) by mouth at bedtime.  Dispense: 90 tablet; Refill: 0  Primary hypertension Assessment & Plan: 117/82 Controlled. Continue current medications. No change in management. Discussed DASH diet and dietary sodium restrictions.  Increase dietary efforts and physical activity.      Charmaine Sahmya Arai, PA-C

## 2024-10-05 NOTE — Assessment & Plan Note (Signed)
 Bipolar disorder managed with Seroquel . Depression and anxiety scores improved significantly since medication change. Patient denies adverse effects and is interested in a dose increase.  - Increase Seroquel  to 200 mg daily. Transition with 150 mg for two weeks, then 200 mg daily. - Scheduled for therapy appointment in November and psychiatric appointment in March. - Follow up with me in 3 months.

## 2024-10-05 NOTE — Assessment & Plan Note (Signed)
 117/82 Controlled. Continue current medications. No change in management. Discussed DASH diet and dietary sodium restrictions.  Increase dietary efforts and physical activity.

## 2024-10-06 ENCOUNTER — Other Ambulatory Visit: Payer: Self-pay | Admitting: Medical Genetics

## 2024-10-06 DIAGNOSIS — Z006 Encounter for examination for normal comparison and control in clinical research program: Secondary | ICD-10-CM

## 2024-10-17 ENCOUNTER — Encounter: Payer: Self-pay | Admitting: Physician Assistant

## 2024-10-17 ENCOUNTER — Other Ambulatory Visit: Payer: Self-pay | Admitting: Physician Assistant

## 2024-10-17 DIAGNOSIS — F3181 Bipolar II disorder: Secondary | ICD-10-CM

## 2024-10-17 MED ORDER — QUETIAPINE FUMARATE 200 MG PO TABS: 200.0000 mg | ORAL_TABLET | Freq: Every day | ORAL | 0 refills | Status: DC

## 2024-11-15 ENCOUNTER — Ambulatory Visit (HOSPITAL_COMMUNITY): Admitting: Psychiatry

## 2024-12-12 ENCOUNTER — Ambulatory Visit: Admitting: Physician Assistant

## 2024-12-19 ENCOUNTER — Ambulatory Visit (HOSPITAL_COMMUNITY)
Admission: RE | Admit: 2024-12-19 | Discharge: 2024-12-19 | Disposition: A | Discharge status: Home / Self Care | Source: Ambulatory Visit | Attending: Physician Assistant | Admitting: Physician Assistant

## 2024-12-19 ENCOUNTER — Ambulatory Visit: Payer: Self-pay | Admitting: Physician Assistant

## 2024-12-19 ENCOUNTER — Encounter: Payer: Self-pay | Admitting: Physician Assistant

## 2024-12-19 ENCOUNTER — Ambulatory Visit: Admitting: Physician Assistant

## 2024-12-19 VITALS — BP 117/80 | HR 67 | Temp 98.1°F | Ht 59.0 in | Wt 229.6 lb

## 2024-12-19 DIAGNOSIS — G8929 Other chronic pain: Secondary | ICD-10-CM | POA: Insufficient documentation

## 2024-12-19 DIAGNOSIS — M25511 Pain in right shoulder: Secondary | ICD-10-CM | POA: Diagnosis present

## 2024-12-19 DIAGNOSIS — F3181 Bipolar II disorder: Secondary | ICD-10-CM | POA: Diagnosis not present

## 2024-12-19 MED ORDER — CELECOXIB 200 MG PO CAPS: 200.0000 mg | ORAL_CAPSULE | Freq: Two times a day (BID) | ORAL | 1 refills | Status: AC

## 2024-12-19 MED ORDER — QUETIAPINE FUMARATE 300 MG PO TABS: 300.0000 mg | ORAL_TABLET | Freq: Every day | ORAL | 1 refills | Status: AC

## 2024-12-19 NOTE — Progress Notes (Addendum)
 "  Acute Office Visit  Subjective:     Patient ID: Cheryl Mcgrath, female    DOB: [birth year], 23 y.o.   MRN: [id number]   Discussed the use of AI scribe software for clinical note transcription with the patient, who gave verbal consent to proceed.  History of Present Illness Cheryl Mcgrath is a 23 year old female who presents with worsening right shoulder pain and numbness.  She reports right shoulder pain for 2 to 3 months, worsening over the past month after a work-related remodeling injury. Pain involves the right shoulder, arm, and hand with numbness and tingling like the arm going to sleep. 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 She notices popping in the shoulder when swinging her arms while walking. She denies neck pain or prior shoulder or neck injuries or accidents. Tylenol  has not helped. She has not tried ibuprofen . A prior muscle relaxer gave only temporary relief.  Additionally, she is interested in increasing her Seroquel , she feels like medication wears off throughout the day, increasing mood related symptoms.     Review of Systems  Constitutional:  Negative for activity change, appetite change and fever.  Musculoskeletal:  Positive for arthralgias. Negative for back pain, gait problem, joint swelling, neck pain and neck stiffness.  Psychiatric/Behavioral:  Positive for dysphoric mood.         Objective:     BP 117/80   Pulse 67   Temp 98.1 F (36.7 C)   Ht 4' 11 (1.499 m)   Wt 229 lb 9.6 oz (104.1 kg)   SpO2 98%   BMI 46.37 kg/m   Physical Exam Constitutional:      General: She is not in acute distress.    Appearance: Normal appearance. She is obese. She is not ill-appearing.  HENT:     Head: Normocephalic and atraumatic.     Mouth/Throat:     Mouth: Mucous membranes are moist.     Pharynx: Oropharynx is clear.  Eyes:     Extraocular Movements: Extraocular movements intact.      Conjunctiva/sclera: Conjunctivae normal.  Cardiovascular:     Rate and Rhythm: Normal rate and regular rhythm.     Heart sounds: Normal heart sounds. No murmur heard. Pulmonary:     Effort: Pulmonary effort is normal.     Breath sounds: Normal breath sounds.  Musculoskeletal:     Right shoulder: Tenderness and bony tenderness present. No swelling, deformity or effusion. Decreased range of motion. Normal strength. Normal pulse.     Right hand: Normal strength. Normal sensation. Normal capillary refill. Normal pulse.  Skin:    General: Skin is warm and dry.  Neurological:     General: No focal deficit present.     Mental Status: She is alert and oriented to person, place, and time.  Psychiatric:        Mood and Affect: Mood normal.        Behavior: Behavior normal.     No results found for any visits on 12/19/24.      Assessment & Plan:  Chronic right shoulder pain Assessment & Plan: Chronic right shoulder pain with numbness and tingling in the right arm and hand, worsening over the past month. - X-ray of the right shoulder today. - Referred to physical therapy for range of motion and strength exercises. - Prescribed Celebrex  twice daily for shoulder pain. - Follow up pending imaging.   Orders: -  DG Shoulder Right -     Celecoxib ; Take 1 capsule (200 mg total) by mouth 2 (two) times daily.  Dispense: 60 capsule; Refill: 1 -     Ambulatory referral to Occupational Therapy  Bipolar 2 disorder (HCC) Assessment & Plan: Current Seroquel  200 mg at bedtime insufficient during the day, affecting mood. - Increased Seroquel  to 300 mg at bedtime. - Scheduled follow-up in six weeks to assess response.   Other orders -     QUEtiapine  Fumarate; Take 1 tablet (300 mg total) by mouth at bedtime.  Dispense: 30 tablet; Refill: 1   Return in about 6 weeks (around 01/30/2025) for mood symptoms .  Romi Rathel, PA-C  "

## 2024-12-19 NOTE — Assessment & Plan Note (Signed)
 Current Seroquel  200 mg at bedtime insufficient during the day, affecting mood. - Increased Seroquel  to 300 mg at bedtime. - Scheduled follow-up in six weeks to assess response.

## 2024-12-19 NOTE — Assessment & Plan Note (Signed)
 Chronic right shoulder pain with numbness and tingling in the right arm and hand, worsening over the past month. - X-ray of the right shoulder today. - Referred to physical therapy for range of motion and strength exercises. - Prescribed Celebrex  twice daily for shoulder pain. - Follow up pending imaging.

## 2024-12-19 NOTE — Addendum Note (Signed)
 Addended by: MANCIL PFEIFFER on: 12/19/2024 12:56 PM   Modules accepted: Orders

## 2025-01-08 ENCOUNTER — Ambulatory Visit: Admitting: Physician Assistant

## 2025-01-09 ENCOUNTER — Encounter (HOSPITAL_COMMUNITY): Payer: Self-pay | Admitting: Occupational Therapy

## 2025-01-09 ENCOUNTER — Ambulatory Visit (HOSPITAL_COMMUNITY): Attending: Physician Assistant | Admitting: Occupational Therapy

## 2025-01-09 DIAGNOSIS — R29898 Other symptoms and signs involving the musculoskeletal system: Secondary | ICD-10-CM | POA: Insufficient documentation

## 2025-01-09 DIAGNOSIS — M25611 Stiffness of right shoulder, not elsewhere classified: Secondary | ICD-10-CM | POA: Insufficient documentation

## 2025-01-09 DIAGNOSIS — M25511 Pain in right shoulder: Secondary | ICD-10-CM | POA: Diagnosis present

## 2025-01-09 DIAGNOSIS — G8929 Other chronic pain: Secondary | ICD-10-CM | POA: Insufficient documentation

## 2025-01-09 NOTE — Therapy (Signed)
 " OUTPATIENT OCCUPATIONAL THERAPY ORTHO EVALUATION  Patient Name: Cheryl Mcgrath MRN: [id number] DOB:June 19, 2001, 24 y.o., female Today's Date: 01/09/2025   END OF SESSION:  OT End of Session - 01/09/25 1251     Visit Number 1    Number of Visits 9    Date for Recertification  03/02/25    Authorization Type [employer] Medicaid    Authorization Time Period Requesting 8 Visits    Authorization - Visit Number 1    Authorization - Number of Visits 8    Progress Note Due on Visit 10    OT Start Time 1133    OT Stop Time 1153    OT Time Calculation (min) 20 min    Activity Tolerance Patient tolerated treatment well    Behavior During Therapy WFL for tasks assessed/performed          Past Medical History:  Diagnosis Date   Anxiety    Depression    High cholesterol    Migraine    Overactive bladder    Suicidal ideation    Past Surgical History:  Procedure Laterality Date   TONSILLECTOMY     Patient Active Problem List   Diagnosis Date Noted   Chronic right shoulder pain 12/19/2024   Injury of right rotator cuff 09/01/2024   BMI 45.0-49.9, adult (HCC) 09/01/2024   HTN (hypertension) 07/07/2024   Bipolar 2 disorder (HCC) 07/07/2024   HLD (hyperlipidemia) 07/07/2024   Anxiety 01/23/2020   Attention deficit hyperactivity disorder (ADHD) 01/23/2020   Elevated DHEA 03/15/2015   MDD (major depressive disorder), single episode, severe , no psychosis (HCC) 09/29/2014    PCP: Grooms, Orick, PA-C REFERRING PROVIDER: Grooms, Clifton, PA-C  ONSET DATE: ~August, 2025  REFERRING DIAG: M25.511,G89.29 (ICD-10-CM) - Chronic right shoulder pain   THERAPY DIAG:  Right shoulder pain, unspecified chronicity  Shoulder stiffness, right  Other symptoms and signs involving the musculoskeletal system  Rationale for Evaluation and Treatment: Rehabilitation  SUBJECTIVE:   SUBJECTIVE STATEMENT: The pain gradually worsens as I use my arm. Pt accompanied by: self  PERTINENT  HISTORY: Pt has been having shoulder pain for several months. No referral to an Orthopedic MD or MRI. X-ray negative.   PRECAUTIONS: None  WEIGHT BEARING RESTRICTIONS: No  PAIN:  Are you having pain? Yes: NPRS scale: 2/10 Pain location: R trapezius and scapula Pain description: Aching, discomfort Aggravating factors: movement Relieving factors: Rest  FALLS: Has patient fallen in last 6 months? No  PLOF: Independent  PATIENT GOALS: To decrease pain  NEXT MD VISIT: 01/30/25  OBJECTIVE:   HAND DOMINANCE: Right  ADLs: Overall ADLs: Pt struggles with lifting, reaching overhead, and any sustained movements. Her most difficulty is noted during cooking, cleaning, and groceries. Pain can be noted with dressing and bathing.   FUNCTIONAL OUTCOME MEASURES: Upper Extremity Functional Scale (UEFS): 59/80  Extreme difficulty/unable (0), Quite a bit of difficulty (1), Moderate difficulty (2), Little difficulty (3), No difficulty (4) Survey date:  01/09/25  Any of your usual work, household or school activities 2  2. Your usual hobbies, recreational/sport activities 0   3. Lifting a bag of groceries to waist level 2   4. Lifting a bag of groceries above your head 1  5. Grooming your hair 3  6. Pushing up on your hands (I.e. from bathtub or chair) 3  7. Preparing food (I.e. peeling/cutting) 4  8. Driving  4  9. Vacuuming, sweeping, or raking 3  10. Dressing  3  11. Doing up buttons  4  12. Using tools/appliances 3  13. Opening doors 4  14. Cleaning  3  15. Tying or lacing shoes 4  16. Sleeping  3  17. Laundering clothes (I.e. washing, ironing, folding) 3  18. Opening a jar 4  19. Throwing a ball 4  20. Carrying a small suitcase with your affected limb.  2  Score total:  59/80     UPPER EXTREMITY ROM:       Assessed in seated, er/IR adducted  Active ROM Right eval  Shoulder flexion 135  Shoulder abduction 136  Shoulder internal rotation 90  Shoulder external rotation 38   (Blank rows = not tested)    UPPER EXTREMITY MMT:     Assessed in seated, er/IR adducted  MMT Right eval  Shoulder flexion 4+/5  Shoulder abduction 4/5  Shoulder internal rotation 4+/5  Shoulder external rotation 4/5  (Blank rows = not tested)  SENSATION: Pt reports tingling in her hand with the most severe sensation in D3 and D4.   EDEMA: No swelling  OBSERVATIONS: Moderate fascial restrictions along biceps, trapezius, scapular region.    TODAY'S TREATMENT:                                                                                                                              DATE:   01/09/25 -AA/ROM: seated - flexion, abduction, protraction, horizontal abduction, er/IR, 10    PATIENT EDUCATION: Education details: AA/ROM and A/ROM Person educated: Patient Education method: Programmer, Multimedia, Facilities Manager, and Handouts Education comprehension: verbalized understanding and returned demonstration  HOME EXERCISE PROGRAM: 1/13: AA/ROM and A/ROM  GOALS: Goals reviewed with patient? Yes   SHORT TERM GOALS: Target date: 03/02/25  Pt will be provided with and educated on HEP to improve mobility in RUE required for use during ADL completion.   Goal status: INITIAL  LONG TERM GOALS: Target date: 03/02/25  Pt will decrease pain in RUE to 3/10 or less to improve ability to sleep for 2+ consecutive hours without waking due to pain.   Goal status: INITIAL  2.  Pt will decrease RUE fascial restrictions to min amounts or less to improve mobility required for functional reaching tasks.   Goal status: INITIAL  3.  Pt will increase RUE A/ROM by 10 degrees to improve ability to use RUE when reaching overhead or behind back during dressing and bathing tasks.   Goal status: INITIAL  4.  Pt will increase RUE strength to 5/5 or greater to improve ability to use RUE when lifting or carrying items during meal preparation/housework/yardwork tasks.   Goal status: INITIAL  5.  Pt  will return to highest level of function using RUE as dominant during functional task completion.   Goal status: INITIAL   ASSESSMENT:  CLINICAL IMPRESSION: Patient is a 24 y.o. female who was seen today for occupational therapy evaluation for R shoulder pain. Pt presents with increased pain and fascial restrictions, decreased ROM, strength, and functional use  of the RUE.   PERFORMANCE DEFICITS: in functional skills including in functional skills including ADLs, IADLs, coordination, tone, ROM, strength, pain, fascial restrictions, muscle spasms, and UE functional use.  IMPAIRMENTS: are limiting patient from ADLs, IADLs, rest and sleep, work, leisure, and social participation.   COMORBIDITIES: has no other co-morbidities that affects occupational performance. Patient will benefit from skilled OT to address above impairments and improve overall function.  MODIFICATION OR ASSISTANCE TO COMPLETE EVALUATION: Min-Moderate modification of tasks or assist with assess necessary to complete an evaluation.  OT OCCUPATIONAL PROFILE AND HISTORY: Detailed assessment: Review of records and additional review of physical, cognitive, psychosocial history related to current functional performance.  CLINICAL DECISION MAKING: LOW - limited treatment options, no task modification necessary  REHAB POTENTIAL: Good  EVALUATION COMPLEXITY: Low      PLAN:  OT FREQUENCY: 1-2x/week  OT DURATION: 6 weeks  PLANNED INTERVENTIONS: 97168 OT Re-evaluation, 97535 self care/ADL training, 02889 therapeutic exercise, 97530 therapeutic activity, 97112 neuromuscular re-education, 97140 manual therapy, 97035 ultrasound, 97010 moist heat, 97032 electrical stimulation (manual), passive range of motion, functional mobility training, energy conservation, coping strategies training, patient/family education, and DME and/or AE instructions  RECOMMENDED OTHER SERVICES: Orthopedic MD  CONSULTED AND AGREED WITH PLAN OF CARE:  Patient  PLAN FOR NEXT SESSION: Manual Therapy, AA/ROM, Isometrics, A/ROM  Valentin Nightingale, OTR/L [facility] [phone] Koah Chisenhall Jillyn Nightingale, OT 01/09/2025, 12:53 PM  [employer] Medicare Auth Request Information Treatment Start Date: 01/09/25  Date of referral: 12/19/24 Referring provider: Charmaine Grooms, PA-C Referring diagnosis (ICD 10)? M25.511,G89.29 (ICD-10-CM) - Chronic right shoulder pain  Treatment diagnosis (ICD 10)? (if different than referring diagnosis) M25.511, M25.611, R29.898  What was this (referring dx) caused by? Unspecified  Nature of Condition: Initial Onset (within last 3 months)   Laterality: Rt  Current Functional Measure Score: Other UEFS (59/80)  Objective measurements identify impairments when they are compared to normal values, the uninvolved extremity, and prior level of function.  [x]  Yes  []  No  Objective assessment of functional ability: Moderate functional limitations   Briefly describe symptoms: Pt has progressively worsening pain with all mobility, along with mildly restricted ROM and weakness.   How did symptoms start: Suddenly then progressively.   Average pain intensity:  Last 24 hours: 5/10  Past week: 7/10  How often does the pt experience symptoms? Constantly  How much have the symptoms interfered with usual daily activities? Moderately  How has condition changed since care began at this facility? NA - initial visit  In general, how is the patients overall health? Good   BACK PAIN (STarT Back Screening Tool) No  "

## 2025-01-09 NOTE — Patient Instructions (Signed)
Perform each exercise ____10-15____ reps. 2-3x days.   1) Protraction   Start by holding a wand or cane at chest height.  Next, slowly push the wand outwards in front of your body so that your elbows become fully straightened. Then, return to the original position.     2) Shoulder FLEXION   In the standing position, hold a wand/cane with both arms, palms down on both sides. Raise up the wand/cane allowing your unaffected arm to perform most of the effort. Your affected arm should be partially relaxed.      3) Internal/External ROTATION   In the standing position, hold a wand/cane with both hands keeping your elbows bent. Move your arms and wand/cane to one side.  Your affected arm should be partially relaxed while your unaffected arm performs most of the effort.       4) Shoulder ABDUCTION   While holding a wand/cane palm face up on the injured side and palm face down on the uninjured side, slowly raise up your injured arm to the side.        5) Horizontal Abduction/Adduction      Straight arms holding cane at shoulder height, bring cane to right, center, left. Repeat starting to left.   Copyright  [employer]. All rights reserved.    Repeat all exercises 10-15 times, 1-2 times per day.  1) Shoulder Protraction    Begin with elbows by your side, slowly "punch" straight out in front of you.      2) Shoulder Flexion  Supine:     Standing:         Begin with arms at your side with thumbs pointed up, slowly raise both arms up and forward towards overhead.               3) Horizontal abduction/adduction  Supine:   Standing:           Begin with arms straight out in front of you, bring out to the side in at "T" shape. Keep arms straight entire time.                 4) Internal & External Rotation   Supine:     Standing:     Stand with elbows at the side and elbows bent 90 degrees. Move your forearms away from your body, then  bring back inward toward the body.     5) Shoulder Abduction  Supine:     Standing:       Lying on your back begin with your arms flat on the table next to your side. Slowly move your arms out to the side so that they go overhead, in a jumping jack or snow angel movement.   

## 2025-01-30 ENCOUNTER — Ambulatory Visit: Admitting: Physician Assistant

## 2025-02-06 ENCOUNTER — Ambulatory Visit (HOSPITAL_COMMUNITY): Admitting: Occupational Therapy

## 2025-02-07 ENCOUNTER — Ambulatory Visit (HOSPITAL_COMMUNITY): Admitting: Psychiatry

## 2025-02-09 ENCOUNTER — Ambulatory Visit: Admitting: Physician Assistant

## 2025-02-12 ENCOUNTER — Ambulatory Visit: Admitting: Physician Assistant

## 2025-02-13 ENCOUNTER — Ambulatory Visit (HOSPITAL_COMMUNITY): Admitting: Occupational Therapy

## 2025-02-20 ENCOUNTER — Ambulatory Visit (HOSPITAL_COMMUNITY): Admitting: Occupational Therapy

## 2025-02-26 ENCOUNTER — Ambulatory Visit (HOSPITAL_COMMUNITY): Admitting: Registered Nurse

## 2025-02-27 ENCOUNTER — Ambulatory Visit (HOSPITAL_COMMUNITY): Admitting: Occupational Therapy

## 2025-03-06 ENCOUNTER — Ambulatory Visit (HOSPITAL_COMMUNITY): Admitting: Occupational Therapy

## 2025-03-13 ENCOUNTER — Ambulatory Visit (HOSPITAL_COMMUNITY): Admitting: Occupational Therapy
